# Patient Record
Sex: Female | Born: 1995 | Race: Black or African American | Hispanic: No | Marital: Single
Health system: Southern US, Community
[De-identification: ages and names within clinical notes are randomized; demographics above are authoritative.]

---

## 2017-05-22 ENCOUNTER — Other Ambulatory Visit: Payer: Self-pay | Admitting: Obstetrics and Gynecology

## 2017-05-22 DIAGNOSIS — N63 Unspecified lump in unspecified breast: Secondary | ICD-10-CM

## 2017-05-27 ENCOUNTER — Other Ambulatory Visit: Payer: Self-pay

## 2017-06-03 ENCOUNTER — Inpatient Hospital Stay: Admission: RE | Admit: 2017-06-03 | Payer: Self-pay | Source: Ambulatory Visit

## 2018-06-10 ENCOUNTER — Other Ambulatory Visit: Payer: Self-pay | Admitting: Obstetrics and Gynecology

## 2018-06-10 DIAGNOSIS — N6320 Unspecified lump in the left breast, unspecified quadrant: Secondary | ICD-10-CM

## 2018-08-04 ENCOUNTER — Encounter (HOSPITAL_COMMUNITY): Payer: Self-pay | Admitting: Family Medicine

## 2018-08-04 ENCOUNTER — Ambulatory Visit (HOSPITAL_COMMUNITY)
Admission: EM | Admit: 2018-08-04 | Discharge: 2018-08-04 | Discharge status: Absent without leave | Payer: BLUE CROSS/BLUE SHIELD | Attending: Family Medicine | Admitting: Family Medicine

## 2018-08-07 NOTE — ED Provider Notes (Signed)
  [facility]   [id number] 08/04/18 Arrival Time: 1608  Patient LWBS or triaged.Mardella Layman, MD 08/07/18 [phone]

## 2018-09-10 ENCOUNTER — Other Ambulatory Visit: Payer: Self-pay

## 2018-09-17 ENCOUNTER — Ambulatory Visit
Admission: RE | Admit: 2018-09-17 | Discharge: 2018-09-17 | Disposition: A | Discharge status: Home / Self Care | Payer: BLUE CROSS/BLUE SHIELD | Source: Ambulatory Visit | Attending: Obstetrics and Gynecology | Admitting: Obstetrics and Gynecology

## 2018-09-17 ENCOUNTER — Other Ambulatory Visit: Payer: Self-pay | Admitting: Obstetrics and Gynecology

## 2018-09-17 DIAGNOSIS — N6320 Unspecified lump in the left breast, unspecified quadrant: Secondary | ICD-10-CM

## 2019-03-07 ENCOUNTER — Other Ambulatory Visit: Payer: BLUE CROSS/BLUE SHIELD

## 2020-02-19 IMAGING — US ULTRASOUND LEFT BREAST LIMITED
1 series · 8 of 8 positions shown · non-contrast
Comparison: Previous exam(s).

CLINICAL DATA: Six-month follow-up left breast mass

EXAM:
ULTRASOUND OF THE LEFT BREAST

[Series 1: ultrasound left breast limited · 0.05mm/px · 8 of 8 slices shown]
[im 1/8]
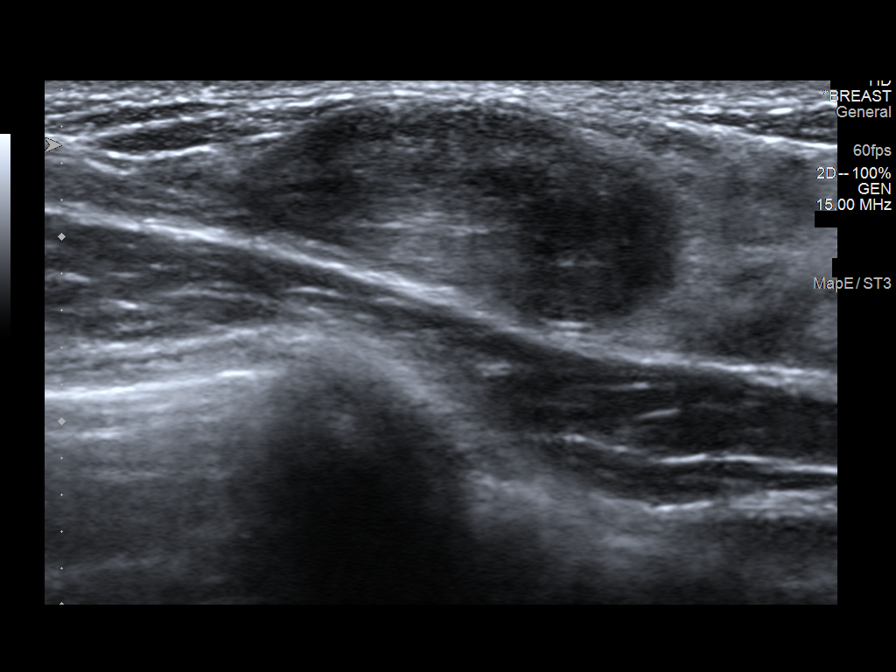
[im 2/8]
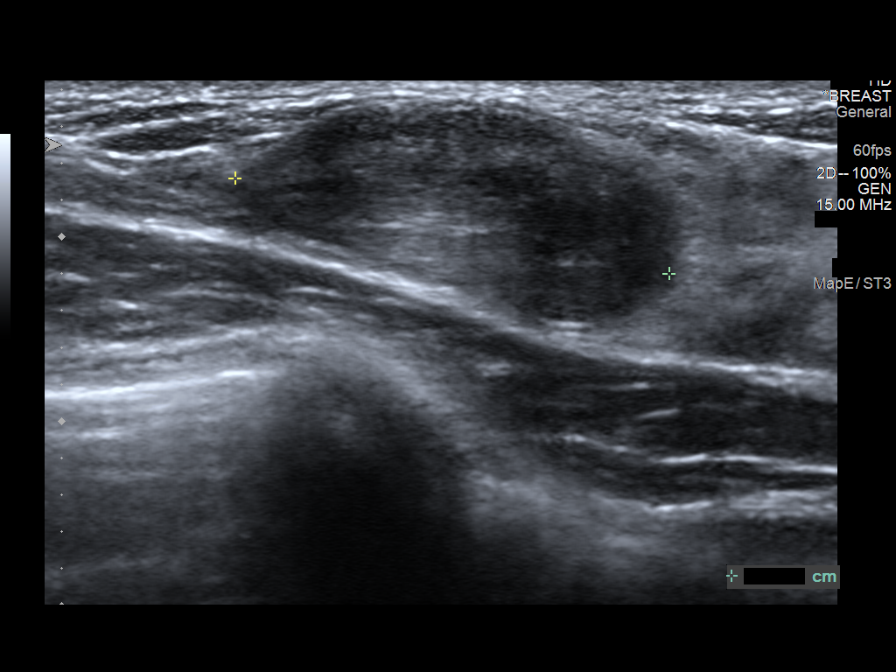
[im 3/8]
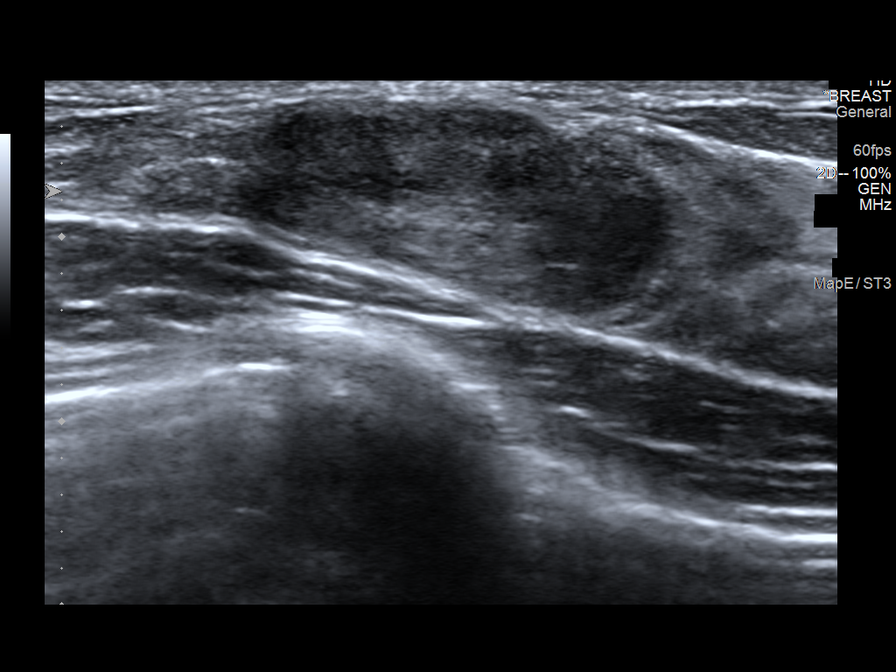
[im 4/8]
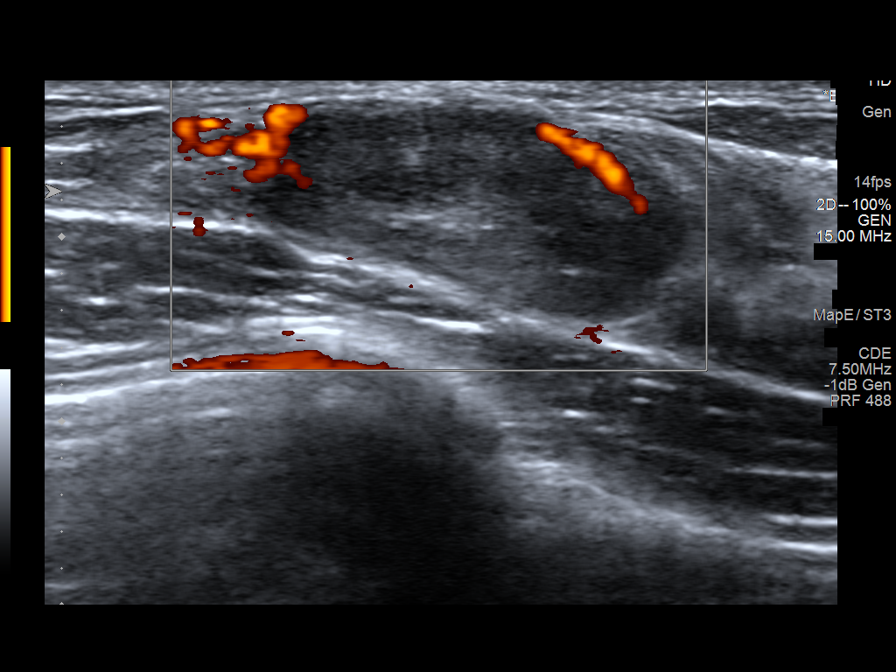
[im 5/8]
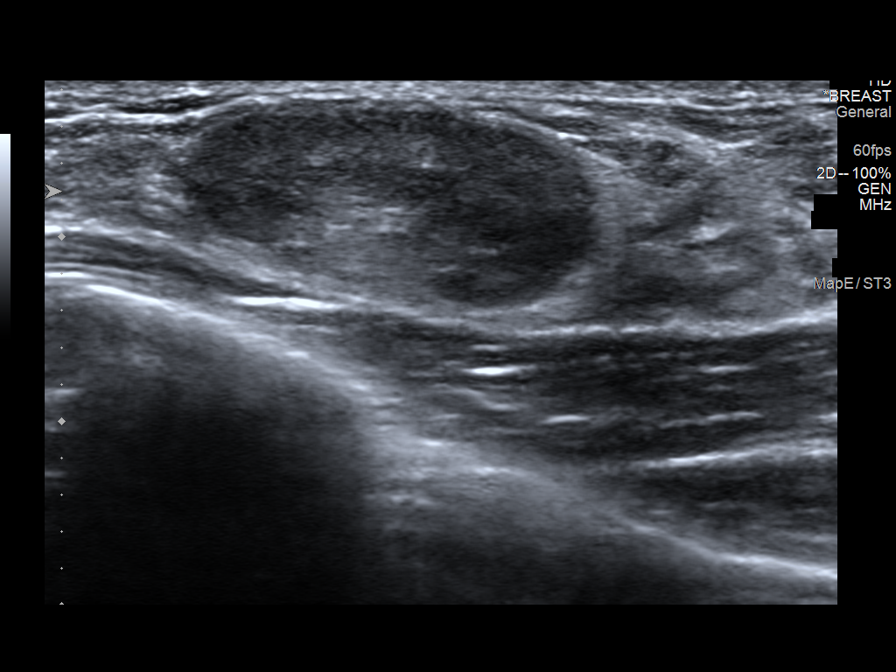
[im 6/8]
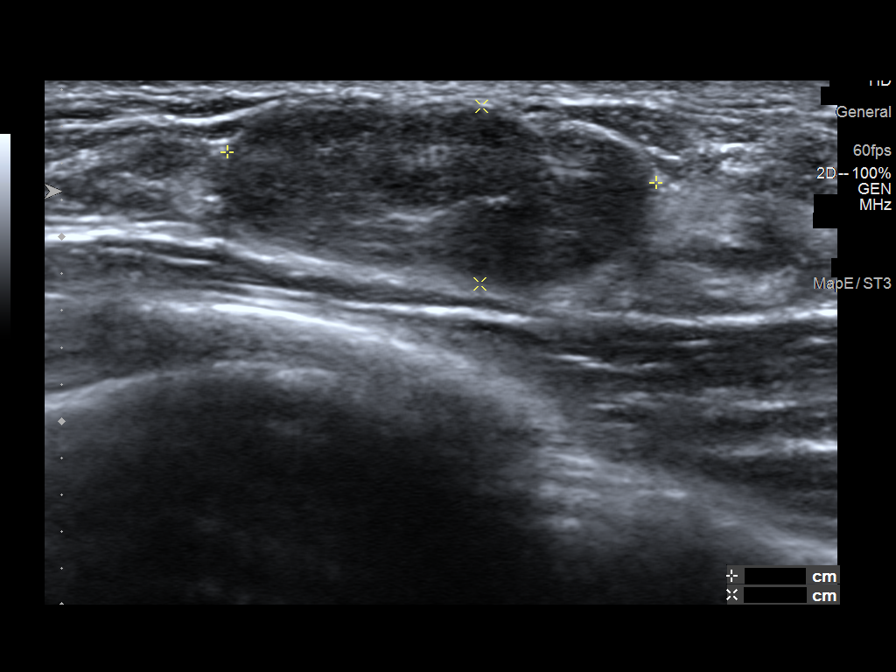
[im 7/8]
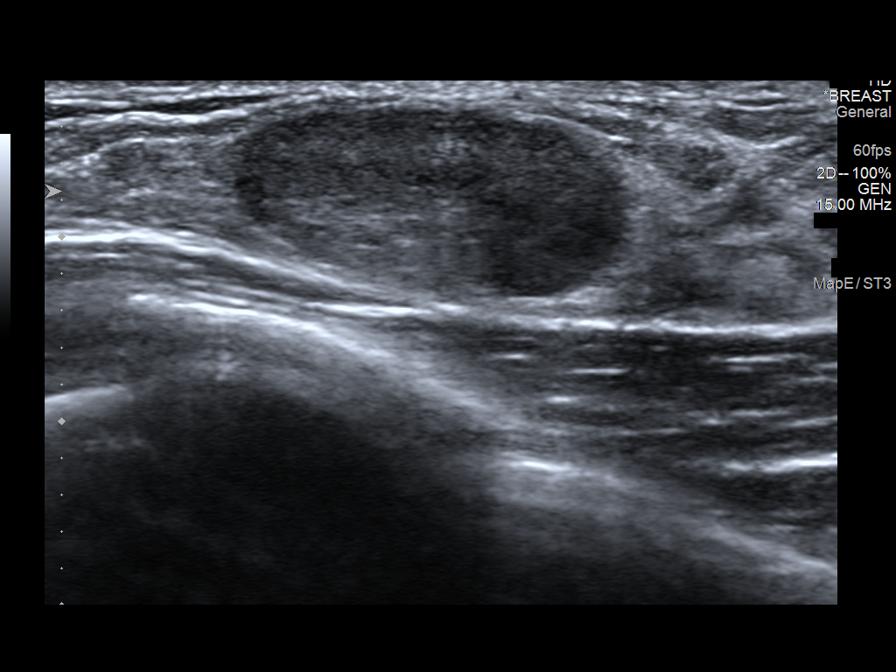
[im 8/8]
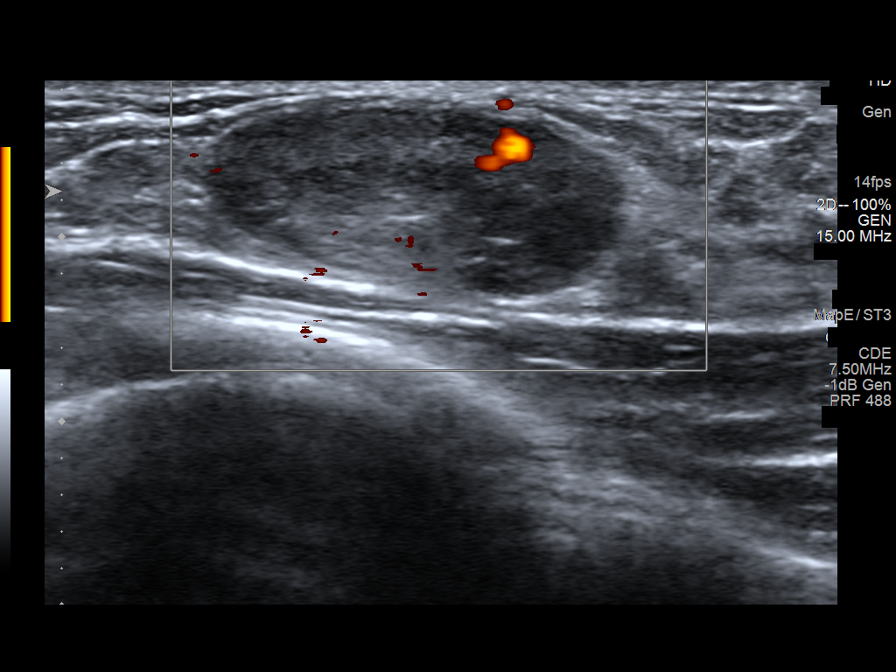

[8 of 8 positions shown; findings below may reference images not displayed]

FINDINGS: Targeted ultrasound is performed, showing 2.3 x 1 x 2.4 cm oval
hypoechoic lesion with echogenic center. This is unchanged compared
to prior ultrasound. This is probably a fibroadenoma.
IMPRESSION: Probable benign findings.

RECOMMENDATION:
Six-month follow-up ultrasound left breast.

I have discussed the findings and recommendations with the patient.
Results were also provided in writing at the conclusion of the
visit. If applicable, a reminder letter will be sent to the patient
regarding the next appointment.

BI-RADS CATEGORY  3: Probably benign.
